# Patient Record
Sex: Male | Born: 1970
Health system: Southern US, Community
[De-identification: ages and names within clinical notes are randomized; demographics above are authoritative.]

## PROBLEM LIST (undated history)

## (undated) DIAGNOSIS — I10 Essential (primary) hypertension: Secondary | ICD-10-CM

## (undated) DIAGNOSIS — R519 Headache, unspecified: Secondary | ICD-10-CM

## (undated) DIAGNOSIS — Z9889 Other specified postprocedural states: Secondary | ICD-10-CM

## (undated) DIAGNOSIS — R51 Headache: Secondary | ICD-10-CM

## (undated) DIAGNOSIS — R112 Nausea with vomiting, unspecified: Secondary | ICD-10-CM

## (undated) HISTORY — PX: WISDOM TOOTH EXTRACTION: SHX21

---

## 2015-06-05 ENCOUNTER — Emergency Department (HOSPITAL_COMMUNITY)
Admission: EM | Admit: 2015-06-05 | Discharge: 2015-06-05 | Disposition: A | Discharge status: Home / Self Care | Payer: Managed Care, Other (non HMO) | Attending: Emergency Medicine | Admitting: Emergency Medicine

## 2015-06-05 ENCOUNTER — Emergency Department (HOSPITAL_COMMUNITY): Payer: Managed Care, Other (non HMO)

## 2015-06-05 ENCOUNTER — Encounter (HOSPITAL_COMMUNITY): Payer: Self-pay | Admitting: Emergency Medicine

## 2015-06-05 DIAGNOSIS — R1032 Left lower quadrant pain: Secondary | ICD-10-CM | POA: Diagnosis present

## 2015-06-05 DIAGNOSIS — K5792 Diverticulitis of intestine, part unspecified, without perforation or abscess without bleeding: Secondary | ICD-10-CM | POA: Insufficient documentation

## 2015-06-05 DIAGNOSIS — Z79899 Other long term (current) drug therapy: Secondary | ICD-10-CM | POA: Insufficient documentation

## 2015-06-05 DIAGNOSIS — I10 Essential (primary) hypertension: Secondary | ICD-10-CM | POA: Diagnosis not present

## 2015-06-05 HISTORY — DX: Essential (primary) hypertension: I10

## 2015-06-05 LAB — COMPREHENSIVE METABOLIC PANEL
ALBUMIN: 3.8 g/dL (ref 3.5–5.0)
ALT: 20 U/L (ref 17–63)
AST: 18 U/L (ref 15–41)
Alkaline Phosphatase: 79 U/L (ref 38–126)
Anion gap: 8 (ref 5–15)
BUN: 14 mg/dL (ref 6–20)
CHLORIDE: 106 mmol/L (ref 101–111)
CO2: 26 mmol/L (ref 22–32)
Calcium: 9.3 mg/dL (ref 8.9–10.3)
Creatinine, Ser: 1.15 mg/dL (ref 0.61–1.24)
GFR calc non Af Amer: >60 mL/min (ref >60)
Glucose, Bld: 101 mg/dL — ABNORMAL HIGH (ref 65–99)
POTASSIUM: 4.1 mmol/L (ref 3.5–5.1)
SODIUM: 140 mmol/L (ref 135–145)
TOTAL PROTEIN: 6.5 g/dL (ref 6.5–8.1)
Total Bilirubin: 0.4 mg/dL (ref 0.3–1.2)

## 2015-06-05 LAB — URINALYSIS, ROUTINE W REFLEX MICROSCOPIC
Bilirubin Urine: NEGATIVE
GLUCOSE, UA: NEGATIVE mg/dL
Hgb urine dipstick: NEGATIVE
KETONES UR: NEGATIVE mg/dL
LEUKOCYTES UA: NEGATIVE
NITRITE: NEGATIVE
PROTEIN: NEGATIVE mg/dL
Specific Gravity, Urine: 1.025 (ref 1.005–1.030)
pH: 6 (ref 5.0–8.0)

## 2015-06-05 LAB — CBC
HEMATOCRIT: 43.2 % (ref 39.0–52.0)
Hemoglobin: 14.3 g/dL (ref 13.0–17.0)
MCH: 28.7 pg (ref 26.0–34.0)
MCHC: 33.1 g/dL (ref 30.0–36.0)
MCV: 86.7 fL (ref 78.0–100.0)
PLATELETS: 171 10*3/uL (ref 150–400)
RBC: 4.98 MIL/uL (ref 4.22–5.81)
RDW: 13.5 % (ref 11.5–15.5)
WBC: 5.9 10*3/uL (ref 4.0–10.5)

## 2015-06-05 LAB — LIPASE, BLOOD: LIPASE: 39 U/L (ref 11–51)

## 2015-06-05 MED ORDER — CIPROFLOXACIN HCL 500 MG PO TABS
500.0000 mg | ORAL_TABLET | Freq: Once | ORAL | Status: AC
  Administered 2015-06-05: 500 mg via ORAL
  Filled 2015-06-05: qty 1

## 2015-06-05 MED ORDER — IOHEXOL 300 MG/ML  SOLN
100.0000 mL | Freq: Once | INTRAMUSCULAR | Status: AC | PRN
  Administered 2015-06-05: 80 mL via INTRAVENOUS

## 2015-06-05 MED ORDER — HYDROCODONE-ACETAMINOPHEN 5-325 MG PO TABS: 2.0000 | ORAL_TABLET | ORAL | Status: DC | PRN

## 2015-06-05 MED ORDER — METRONIDAZOLE 500 MG PO TABS
500.0000 mg | ORAL_TABLET | Freq: Once | ORAL | Status: AC
  Administered 2015-06-05: 500 mg via ORAL
  Filled 2015-06-05: qty 1

## 2015-06-05 MED ORDER — METRONIDAZOLE 500 MG PO TABS: 500.0000 mg | ORAL_TABLET | Freq: Three times a day (TID) | ORAL | Status: DC

## 2015-06-05 MED ORDER — CIPROFLOXACIN HCL 500 MG PO TABS: 500.0000 mg | ORAL_TABLET | Freq: Two times a day (BID) | ORAL | Status: DC

## 2015-06-05 MED ORDER — DOCUSATE SODIUM 100 MG PO CAPS: 100.0000 mg | ORAL_CAPSULE | Freq: Two times a day (BID) | ORAL | Status: DC

## 2015-06-05 NOTE — ED Notes (Signed)
CT notified that patient has completed contrast 

## 2015-06-05 NOTE — ED Notes (Signed)
Pt has returned from CT, NAD.

## 2015-06-05 NOTE — ED Provider Notes (Signed)
CSN: [id number]     Arrival date & time 06/05/15  0531 History   First MD Initiated Contact with Patient 06/05/15 [phone]     Chief Complaint  Patient presents with  . Abdominal Pain      HPI  She presents for evaluation of abdominal pain. Today's Tuesday. His symptoms started on Friday. Had some nausea. One episode of vomiting. Had some diarrhea for that day as well. Felt better the next day. However since that time he's had slowly progressive abdominal pain now localized to his left lower quadrant. Not severe. Pain with excessive movement. No pain with ADLs. No fevers no chills no bloody stools no nausea no vomiting since the initial episode. Has never had colonoscopy. His never been diagnosed with diverticulitis or previous diverticulitis. No abdominal surgeries.  Past Medical History  Diagnosis Date  . Hypertension    History reviewed. No pertinent past surgical history. No family history on file. Social History  Substance Use Topics  . Smoking status: Never Smoker   . Smokeless tobacco: None  . Alcohol Use: Yes    Review of Systems  Constitutional: Negative for fever, chills, diaphoresis, appetite change and fatigue.  HENT: Negative for mouth sores, sore throat and trouble swallowing.   Eyes: Negative for visual disturbance.  Respiratory: Negative for cough, chest tightness, shortness of breath and wheezing.   Cardiovascular: Negative for chest pain.  Gastrointestinal: Positive for nausea, vomiting, abdominal pain and diarrhea. Negative for abdominal distention.  Endocrine: Negative for polydipsia, polyphagia and polyuria.  Genitourinary: Negative for dysuria, frequency and hematuria.  Musculoskeletal: Negative for gait problem.  Skin: Negative for color change, pallor and rash.  Neurological: Negative for dizziness, syncope, light-headedness and headaches.  Hematological: Does not bruise/bleed easily.  Psychiatric/Behavioral: Negative for behavioral problems and confusion.        Allergies  Review of patient's allergies indicates no known allergies.  Home Medications   Prior to Admission medications   Medication Sig Start Date End Date Taking? Authorizing Provider  ciprofloxacin (CIPRO) 500 MG tablet Take 1 tablet (500 mg total) by mouth every 12 (twelve) hours. 06/05/15   Rolland PorterMark Kay Ricciuti, MD  lisinopril (PRINIVIL,ZESTRIL) 20 MG tablet Take 20 mg by mouth daily. 03/15/15  Yes Historical Provider, MD  metroNIDAZOLE (FLAGYL) 500 MG tablet Take 1 tablet (500 mg total) by mouth 3 (three) times daily. 06/05/15   Rolland PorterMark Chevon Laufer, MD   BP 115/81 mmHg  Pulse 54  Temp(Src) 97.9 F (36.6 C) (Oral)  Resp 14  Ht 5\' 10"  (1.778 m)  Wt 222 lb (100.699 kg)  BMI 31.85 kg/m2  SpO2 96% Physical Exam  Constitutional: He is oriented to person, place, and time. He appears well-developed and well-nourished. No distress.  HENT:  Head: Normocephalic.  Eyes: Conjunctivae are normal. Pupils are equal, round, and reactive to light. No scleral icterus.  Neck: Normal range of motion. Neck supple. No thyromegaly present.  Cardiovascular: Normal rate and regular rhythm.  Exam reveals no gallop and no friction rub.   No murmur heard. Pulmonary/Chest: Effort normal and breath sounds normal. No respiratory distress. He has no wheezes. He has no rales.  Abdominal: Soft. Bowel sounds are normal. He exhibits no distension. There is tenderness. There is no rebound.    Musculoskeletal: Normal range of motion.  Neurological: He is alert and oriented to person, place, and time.  Skin: Skin is warm and dry. No rash noted.  Psychiatric: He has a normal mood and affect. His behavior is normal.  ED Course  Procedures (including critical care time) Labs Review Labs Reviewed  COMPREHENSIVE METABOLIC PANEL - Abnormal; Notable for the following:    Glucose, Bld 101 (*)    All other components within normal limits  LIPASE, BLOOD  CBC  URINALYSIS, ROUTINE W REFLEX MICROSCOPIC (NOT AT [facility])     [facility] Abdomen Pelvis W Contrast  06/05/2015  CLINICAL DATA:  44 year old male with left lower quadrant pain for 1 week. Nausea and vomiting. Initial encounter. EXAM: CT ABDOMEN AND PELVIS WITH CONTRAST TECHNIQUE: Multidetector CT imaging of the abdomen and pelvis was performed using the standard protocol following bolus administration of intravenous contrast. CONTRAST:  80mL OMNIPAQUE IOHEXOL 300 MG/ML  SOLN COMPARISON:  None. FINDINGS: Dependent and streaky bilateral lower lobe opacity. No pleural effusions. Minimal atelectasis in the lingula. Other visible lung parenchyma is normal. No pericardial effusion. Negative visualized mediastinum. Mild lower lumbar facet degeneration. No acute osseous abnormality identified. No pelvic free fluid. Urinary bladder is mildly distended but otherwise unremarkable. Negative rectum aside from retained stool. Redundant sigmoid colon, partially gas distended. Moderate diverticulosis of the proximal sigmoid colon and continuing into the descending colon. No sigmoid inflammation. There is mild mesenteric stranding about the distal left colon diverticula (series 2, image 79). No extraluminal gas. Mild associated colonic wall thickening. No mesenteric lymphadenopathy. Diverticulosis abates in the proximal left colon. Negative splenic flexure and transverse colon aside from mild redundancy and retained stool. Oral contrast at the hepatic flexure. Negative right colon and appendix. Negative terminal ileum. No dilated or abnormal small bowel loops. Negative stomach and duodenum. No pneumoperitoneum or abdominal free fluid. Subtle evidence of cholelithiasis, otherwise negative gallbladder. No pericholecystic inflammation. Spleen, pancreas, and adrenal glands within normal limits. Portal venous system is patent. Major arterial structures are patent. Minimal calcified aortic atherosclerosis. No lymphadenopathy. Bilateral renal enhancement and contrast excretion within  normal limits. IMPRESSION: 1. Short 2-3 cm segment of acute inflammation in the distal left colon with mild wall thickening compatible with acute diverticulitis. No complicating features. Follow-up is recommended to exclude neoplasm which can sometimes mimic this appearance. 2. Sigmoid diverticulosis without active inflammation. 3. Cholelithiasis. 4. Streaky and confluent bilateral lower lobe opacity, but favor atelectasis. Electronically Signed   By: Odessa Fleming M.D.   On: 06/05/2015 10:25   I have personally reviewed and evaluated these images and lab results as part of my medical decision-making.   EKG Interpretation None      MDM   Final diagnoses:  Acute diverticulitis    CT does show a small segment of colonic inflammation adjacent diverticuli. I discussed this with him at length. Given by mouth Cipro, and by mouth Flagyl. Very appropriate for discharge home. Return precautions discussed.    Rolland Porter, MD 06/05/15 1046

## 2015-06-05 NOTE — Discharge Instructions (Signed)
Diverticulitis  Diverticulitis is when small pockets that have formed in your colon (large intestine) become infected or swollen.  HOME CARE  · Follow your doctor's instructions.  · Follow a special diet if told by your doctor.  · When you feel better, your doctor may tell you to change your diet. You may be told to eat a lot of fiber. Fruits and vegetables are good sources of fiber. Fiber makes it easier to poop (have bowel movements).  · Take supplements or probiotics as told by your doctor.  · Only take medicines as told by your doctor.  · Keep all follow-up visits with your doctor.  GET HELP IF:  · Your pain does not get better.  · You have a hard time eating food.  · You are not pooping like normal.  GET HELP RIGHT AWAY IF:  · Your pain gets worse.  · Your problems do not get better.  · Your problems suddenly get worse.  · You have a fever.  · You keep throwing up (vomiting).  · You have bloody or black, tarry poop (stool).  MAKE SURE YOU:   · Understand these instructions.  · Will watch your condition.  · Will get help right away if you are not doing well or get worse.     This information is not intended to replace advice given to you by your health care provider. Make sure you discuss any questions you have with your health care provider.     Document Released: 12/03/2007 Document Revised: 06/21/2013 Document Reviewed: 05/11/2013  Elsevier Interactive Patient Education ©2016 [employer]

## 2015-06-05 NOTE — ED Notes (Signed)
Pt. reports intermittent LLQ pain with nausea and emesis onset Saturday , denies fever or diarrhea. Denies pain at arrival.

## 2015-06-05 NOTE — ED Notes (Signed)
Pt transported to CT ?

## 2015-09-11 ENCOUNTER — Other Ambulatory Visit: Payer: Self-pay | Admitting: Gastroenterology

## 2015-09-11 DIAGNOSIS — K5732 Diverticulitis of large intestine without perforation or abscess without bleeding: Secondary | ICD-10-CM

## 2015-09-19 ENCOUNTER — Ambulatory Visit
Admission: RE | Admit: 2015-09-19 | Discharge: 2015-09-19 | Disposition: A | Discharge status: Home / Self Care | Payer: Managed Care, Other (non HMO) | Source: Ambulatory Visit | Attending: Gastroenterology | Admitting: Gastroenterology

## 2015-09-19 DIAGNOSIS — K5732 Diverticulitis of large intestine without perforation or abscess without bleeding: Secondary | ICD-10-CM

## 2015-10-05 ENCOUNTER — Other Ambulatory Visit (HOSPITAL_COMMUNITY): Payer: Self-pay | Admitting: Gastroenterology

## 2015-10-05 DIAGNOSIS — R1011 Right upper quadrant pain: Secondary | ICD-10-CM

## 2015-10-16 ENCOUNTER — Ambulatory Visit (HOSPITAL_COMMUNITY): Payer: Managed Care, Other (non HMO)

## 2015-10-23 ENCOUNTER — Ambulatory Visit (HOSPITAL_COMMUNITY)
Admission: RE | Admit: 2015-10-23 | Discharge: 2015-10-23 | Disposition: A | Discharge status: Home / Self Care | Payer: Managed Care, Other (non HMO) | Source: Ambulatory Visit | Attending: Gastroenterology | Admitting: Gastroenterology

## 2015-10-23 DIAGNOSIS — R1011 Right upper quadrant pain: Secondary | ICD-10-CM | POA: Insufficient documentation

## 2015-10-23 MED ORDER — TECHNETIUM TC 99M MEBROFENIN IV KIT
5.0000 | PACK | Freq: Once | INTRAVENOUS | Status: AC | PRN
  Administered 2015-10-23: 5 via INTRAVENOUS

## 2015-11-29 ENCOUNTER — Ambulatory Visit: Payer: Self-pay | Admitting: General Surgery

## 2015-11-29 NOTE — H&P (Signed)
History of Present Illness [facility](Romero Letizia MD; 11/29/2015 9:35 AM) The patient is a 45 year old male who presents for evaluation of gall stones. The patient is a 45 year old male who is referred by Dr. Evette CristalGanem for evaluation of symptomatic stones. Patient states she's had her quadrant/back pain over the last several months. He states becoming more frequent. Patient on ultrasound reveals sludge. Patient also had a HIDA scan which resulted in a normal ejection fraction.  Patient states that the pain usually resolves after high-fat diet.   Other Problems [facility](Ashley Beck, CMA; 11/29/2015 9:14 AM) Anxiety Disorder Back Pain Cholelithiasis High blood pressure  Past Surgical History [facility](Ashley Beck, CMA; 11/29/2015 9:14 AM) Oral Surgery  Allergies [facility](Ashley Beck, CMA; 11/29/2015 9:14 AM) No Known Drug Allergies06/07/2015  Medication History [facility](Ashley Beck, CMA; 11/29/2015 9:14 AM) Lisinopril-Hydrochlorothiazide (20-12.5MG  Tablet, Oral) Active. Medications Reconciled  Social History [facility]([place]; 11/29/2015 9:14 AM) Alcohol use Moderate alcohol use. No drug use Tobacco use Never smoker.  Family History [facility]([place]; 11/29/2015 9:14 AM) Arthritis Mother. Diabetes Mellitus Mother. Hypertension Mother. Respiratory Condition Father.    Review of Systems [facility](Sitlali Koerner MD; 11/29/2015 9:34 AM) General Present- Feeling well. Not Present- Fever. Skin Not Present- Change in Wart/Mole, Dryness, Hives, Jaundice, New Lesions, Non-Healing Wounds, Rash and Ulcer. HEENT Not Present- Earache, Hearing Loss, Hoarseness, Nose Bleed, Oral Ulcers, Ringing in the Ears, Seasonal Allergies, Sinus Pain, Sore Throat, Visual Disturbances, Wears glasses/contact lenses and Yellow Eyes. Respiratory Not Present- Cough and Difficulty Breathing. Cardiovascular Not Present- Chest Pain, Difficulty Breathing Lying Down, Leg Cramps, Palpitations, Rapid Heart Rate, Shortness of Breath and Swelling of  Extremities. Gastrointestinal Present- Change in Bowel Habits. Not Present- Abdominal Pain, Bloating, Bloody Stool, Chronic diarrhea, Constipation, Difficulty Swallowing, Excessive gas, Gets full quickly at meals, Hemorrhoids, Indigestion, Nausea, Rectal Pain and Vomiting. Musculoskeletal Not Present- Myalgia. Neurological Present- Headaches. Not Present- Decreased Memory, Fainting, Numbness, Seizures, Tingling, Tremor, Trouble walking and Weakness.  Vitals [facility](Ashley Beck CMA; 11/29/2015 9:15 AM) 11/29/2015 9:14 AM Weight: 225.8 lb Height: 70in Body Surface Area: 2.2 m Body Mass Index: 32.4 kg/m  Temp.: 97.48F  Pulse: 72 (Regular)  BP: 130/68 (Sitting, Left Arm, Standard)       Physical Exam [facility](Shandi Godfrey, MD; 11/29/2015 9:35 AM) General Mental Status-Alert. General Appearance-Consistent with stated age. Hydration-Well hydrated. Voice-Normal.  Head and Neck Head-normocephalic, atraumatic with no lesions or palpable masses.  Eye Eyeball - Bilateral-Extraocular movements intact. Sclera/Conjunctiva - Bilateral-No scleral icterus.  Chest and Lung Exam Chest and lung exam reveals -quiet, even and easy respiratory effort with no use of accessory muscles. Inspection Chest Wall - Normal. Back - normal.  Cardiovascular Cardiovascular examination reveals -normal heart sounds, regular rate and rhythm with no murmurs.  Abdomen Inspection Normal Exam - No Hernias. Palpation/Percussion Normal exam - Soft, Non Tender, No Rebound tenderness, No Rigidity (guarding) and No hepatosplenomegaly. Auscultation Normal exam - Bowel sounds normal.  Neurologic Neurologic evaluation reveals -alert and oriented x 3 with no impairment of recent or remote memory. Mental Status-Normal.  Musculoskeletal Normal Exam - Left-Upper Extremity Strength Normal and Lower Extremity Strength Normal. Normal Exam - Right-Upper Extremity Strength Normal, Lower Extremity  Weakness.    Assessment & Plan [facility](Jaqueline Uber MD; 11/29/2015 9:35 AM) SYMPTOMATIC CHOLELITHIASIS (K80.20) Impression: 45 year old male with symptomatic sludge/stones.  1. We will proceed to the operating room for a laparoscopic cholecystectomy 2. Risks and benefits were discussed with the patient to generally include, but not limited to: infection, bleeding, possible need for post op ERCP,  damage to the bile ducts, bile leak, and possible need for further surgery. Alternatives were offered and described. All questions were answered and the patient voiced understanding of the procedure and wishes to proceed at this point with a laparoscopic cholecystectomy

## 2015-12-06 NOTE — Patient Instructions (Signed)
[facility]  12/06/2015   Your procedure is scheduled on: 12/14/2015    [facility] to 3rd floor to  Short Stay [facility] at   1245pm.  Call this number if you have problems the morning of surgery [phone]   Remember: ONLY 1 PERSON MAY GO WITH YOU TO SHORT STAY TO GET  READY MORNING OF YOUR SURGERY.  Do not eat food after midnite.  May have clear liquids from 12 midnite until 0800am then nothing by mouth.       Take these medicines the morning of surgery with A SIP OF WATER: none                                 You may not have any metal on your body including hair pins and                     Men may shave face and neck.   Do not bring valuables to the hospital. [facility] IS NOT             RESPONSIBLE   FOR VALUABLES.  Contacts, dentures or bridgework may not be worn into surgery. .     Patients discharged the day of surgery will not be allowed to drive home.  Name and phone number of your driver:  Special Instructions: N/A              Please read over the following fact sheets you were given: _____________________________________________________________________                CLEAR LIQUID DIET   Foods Allowed                                                                     Foods Excluded  Coffee and tea, regular and decaf                             liquids that you cannot  Plain Jell-O in any flavor                                             see through such as: Fruit ices (not with fruit pulp)                                     milk, soups, orange juice  Iced Popsicles                                    All solid food Carbonated beverages, regular and diet  Cranberry, grape and apple juices Sports drinks like Gatorade Lightly seasoned clear broth or consume(fat free) Sugar, honey syrup  Sample Menu Breakfast                                Lunch                                      Supper Cranberry juice                    Beef broth                            Chicken broth Jell-O                                     Grape juice                           Apple juice Coffee or tea                        Jell-O                                      Popsicle                                                Coffee or tea                        Coffee or tea  _____________________________________________________________________  [facility] - Preparing for Surgery Before surgery, you can play an important role.  Because skin is not sterile, your skin needs to be as free of germs as possible.  You can reduce the number of germs on your skin by washing with CHG (chlorahexidine gluconate) soap before surgery.  CHG is an antiseptic cleaner which kills germs and bonds with the skin to continue killing germs even after washing. Please DO NOT use if you have an allergy to CHG or antibacterial soaps.  If your skin becomes reddened/irritated stop using the CHG and inform your nurse when you arrive at Short Stay. Do not shave (including legs and underarms) for at least 48 hours prior to the first CHG shower.  You may shave your face/neck. Please follow these instructions carefully:  1.  Shower with CHG Soap the night before surgery and the  morning of Surgery.  2.  If you choose to wash your hair, wash your hair first as usual with your  normal  shampoo.  3.  After you shampoo, rinse your hair and body thoroughly to remove the  shampoo.                           4.  Use CHG as you would any other liquid soap.  You can apply chg directly  to the skin and wash  Gently with a scrungie or clean washcloth.  5.  Apply the CHG Soap to your body ONLY FROM THE NECK DOWN.   Do not use on face/ open                           Wound or open sores. Avoid contact with eyes, ears mouth and genitals (private parts).                       Wash face,   Genitals (private parts) with your normal soap.             6.  Wash thoroughly, paying special attention to the area where your surgery  will be performed.  7.  Thoroughly rinse your body with warm water from the neck down.  8.  DO NOT shower/wash with your normal soap after using and rinsing off  the CHG Soap.                9.  Pat yourself dry with a clean towel.            10.  Wear clean pajamas.            11.  Place clean sheets on your bed the night of your first shower and do not  sleep with pets. Day of Surgery : Do not apply any lotions/deodorants the morning of surgery.  Please wear clean clothes to the hospital/surgery [facility].  FAILURE TO FOLLOW THESE INSTRUCTIONS MAY RESULT IN THE CANCELLATION OF YOUR SURGERY PATIENT SIGNATURE_________________________________  NURSE SIGNATURE__________________________________  ________________________________________________________________________

## 2015-12-07 ENCOUNTER — Encounter (HOSPITAL_COMMUNITY): Payer: Self-pay

## 2015-12-07 ENCOUNTER — Encounter (HOSPITAL_COMMUNITY)
Admission: RE | Admit: 2015-12-07 | Discharge: 2015-12-07 | Disposition: A | Discharge status: Home / Self Care | Payer: Managed Care, Other (non HMO) | Source: Ambulatory Visit | Attending: General Surgery | Admitting: General Surgery

## 2015-12-07 DIAGNOSIS — Z01810 Encounter for preprocedural cardiovascular examination: Secondary | ICD-10-CM | POA: Insufficient documentation

## 2015-12-07 DIAGNOSIS — I10 Essential (primary) hypertension: Secondary | ICD-10-CM | POA: Diagnosis not present

## 2015-12-07 DIAGNOSIS — K8080 Other cholelithiasis without obstruction: Secondary | ICD-10-CM | POA: Insufficient documentation

## 2015-12-07 DIAGNOSIS — Z01812 Encounter for preprocedural laboratory examination: Secondary | ICD-10-CM | POA: Insufficient documentation

## 2015-12-07 HISTORY — DX: Other specified postprocedural states: Z98.890

## 2015-12-07 HISTORY — DX: Headache: R51

## 2015-12-07 HISTORY — DX: Headache, unspecified: R51.9

## 2015-12-07 HISTORY — DX: Nausea with vomiting, unspecified: R11.2

## 2015-12-07 LAB — CBC
HCT: 45 % (ref 39.0–52.0)
Hemoglobin: 15 g/dL (ref 13.0–17.0)
MCH: 29.1 pg (ref 26.0–34.0)
MCHC: 33.3 g/dL (ref 30.0–36.0)
MCV: 87.4 fL (ref 78.0–100.0)
PLATELETS: 203 10*3/uL (ref 150–400)
RBC: 5.15 MIL/uL (ref 4.22–5.81)
RDW: 13.6 % (ref 11.5–15.5)
WBC: 6.5 10*3/uL (ref 4.0–10.5)

## 2015-12-07 LAB — BASIC METABOLIC PANEL
ANION GAP: 8 (ref 5–15)
BUN: 18 mg/dL (ref 6–20)
CO2: 28 mmol/L (ref 22–32)
Calcium: 9.5 mg/dL (ref 8.9–10.3)
Chloride: 101 mmol/L (ref 101–111)
Creatinine, Ser: 1.18 mg/dL (ref 0.61–1.24)
GFR calc Af Amer: >60 mL/min (ref >60)
Glucose, Bld: 97 mg/dL (ref 65–99)
POTASSIUM: 4.4 mmol/L (ref 3.5–5.1)
SODIUM: 137 mmol/L (ref 135–145)

## 2015-12-14 ENCOUNTER — Ambulatory Visit (HOSPITAL_COMMUNITY)
Admission: RE | Admit: 2015-12-14 | Discharge: 2015-12-14 | Disposition: A | Discharge status: Home / Self Care | Payer: Managed Care, Other (non HMO) | Source: Ambulatory Visit | Attending: General Surgery | Admitting: General Surgery

## 2015-12-14 ENCOUNTER — Ambulatory Visit (HOSPITAL_COMMUNITY): Payer: Managed Care, Other (non HMO) | Admitting: Anesthesiology

## 2015-12-14 ENCOUNTER — Encounter (HOSPITAL_COMMUNITY): Payer: Self-pay | Admitting: *Deleted

## 2015-12-14 ENCOUNTER — Encounter (HOSPITAL_COMMUNITY)
Admission: RE | Disposition: A | Discharge status: Home / Self Care | Payer: Self-pay | Source: Ambulatory Visit | Attending: General Surgery

## 2015-12-14 DIAGNOSIS — Z79899 Other long term (current) drug therapy: Secondary | ICD-10-CM | POA: Diagnosis not present

## 2015-12-14 DIAGNOSIS — K819 Cholecystitis, unspecified: Secondary | ICD-10-CM | POA: Diagnosis not present

## 2015-12-14 DIAGNOSIS — I10 Essential (primary) hypertension: Secondary | ICD-10-CM | POA: Insufficient documentation

## 2015-12-14 DIAGNOSIS — K8020 Calculus of gallbladder without cholecystitis without obstruction: Secondary | ICD-10-CM | POA: Diagnosis present

## 2015-12-14 HISTORY — PX: CHOLECYSTECTOMY: SHX55

## 2015-12-14 SURGERY — LAPAROSCOPIC CHOLECYSTECTOMY
Anesthesia: General | Site: Abdomen

## 2015-12-14 MED ORDER — OXYCODONE HCL 5 MG PO TABS
5.0000 mg | ORAL_TABLET | ORAL | Status: DC | PRN
  Administered 2015-12-14: 10 mg via ORAL
  Filled 2015-12-14: qty 2

## 2015-12-14 MED ORDER — SODIUM CHLORIDE 0.9% FLUSH: 3.0000 mL | Freq: Two times a day (BID) | INTRAVENOUS | Status: DC

## 2015-12-14 MED ORDER — CHLORHEXIDINE GLUCONATE 4 % EX LIQD: 1.0000 "application " | Freq: Once | CUTANEOUS | Status: DC

## 2015-12-14 MED ORDER — ACETAMINOPHEN 650 MG RE SUPP: 650.0000 mg | RECTAL | Status: DC | PRN

## 2015-12-14 MED ORDER — LIDOCAINE HCL (CARDIAC) 20 MG/ML IV SOLN
INTRAVENOUS | Status: DC | PRN
  Administered 2015-12-14: 100 mg via INTRAVENOUS

## 2015-12-14 MED ORDER — ONDANSETRON HCL 4 MG/2ML IJ SOLN
INTRAMUSCULAR | Status: AC
  Filled 2015-12-14: qty 2

## 2015-12-14 MED ORDER — HYDROMORPHONE HCL 1 MG/ML IJ SOLN
0.2500 mg | INTRAMUSCULAR | Status: DC | PRN
  Administered 2015-12-14 (×4): 0.5 mg via INTRAVENOUS

## 2015-12-14 MED ORDER — MORPHINE SULFATE (PF) 10 MG/ML IV SOLN: 2.0000 mg | INTRAVENOUS | Status: DC | PRN

## 2015-12-14 MED ORDER — CEFAZOLIN SODIUM-DEXTROSE 2-4 GM/100ML-% IV SOLN
2.0000 g | INTRAVENOUS | Status: AC
  Administered 2015-12-14: 2 g via INTRAVENOUS
  Filled 2015-12-14: qty 100

## 2015-12-14 MED ORDER — DEXAMETHASONE SODIUM PHOSPHATE 10 MG/ML IJ SOLN
INTRAMUSCULAR | Status: DC | PRN
  Administered 2015-12-14: 10 mg via INTRAVENOUS

## 2015-12-14 MED ORDER — CEFAZOLIN SODIUM-DEXTROSE 2-4 GM/100ML-% IV SOLN
INTRAVENOUS | Status: AC
  Filled 2015-12-14: qty 100

## 2015-12-14 MED ORDER — EPHEDRINE SULFATE 50 MG/ML IJ SOLN
INTRAMUSCULAR | Status: AC
  Filled 2015-12-14: qty 1

## 2015-12-14 MED ORDER — FENTANYL CITRATE (PF) 100 MCG/2ML IJ SOLN
INTRAMUSCULAR | Status: DC | PRN
  Administered 2015-12-14 (×2): 100 ug via INTRAVENOUS
  Administered 2015-12-14: 50 ug via INTRAVENOUS

## 2015-12-14 MED ORDER — MIDAZOLAM HCL 5 MG/5ML IJ SOLN
INTRAMUSCULAR | Status: DC | PRN
  Administered 2015-12-14: 2 mg via INTRAVENOUS

## 2015-12-14 MED ORDER — BUPIVACAINE-EPINEPHRINE (PF) 0.25% -1:200000 IJ SOLN
INTRAMUSCULAR | Status: DC | PRN
  Administered 2015-12-14: 13 mL

## 2015-12-14 MED ORDER — 0.9 % SODIUM CHLORIDE (POUR BTL) OPTIME
TOPICAL | Status: DC | PRN
  Administered 2015-12-14: 1000 mL

## 2015-12-14 MED ORDER — PROPOFOL 10 MG/ML IV BOLUS
INTRAVENOUS | Status: AC
  Filled 2015-12-14: qty 20

## 2015-12-14 MED ORDER — SUGAMMADEX SODIUM 200 MG/2ML IV SOLN
INTRAVENOUS | Status: DC | PRN
  Administered 2015-12-14: 200 mg via INTRAVENOUS

## 2015-12-14 MED ORDER — HYDROMORPHONE HCL 1 MG/ML IJ SOLN
INTRAMUSCULAR | Status: AC
  Filled 2015-12-14: qty 1

## 2015-12-14 MED ORDER — SODIUM CHLORIDE 0.9% FLUSH: 3.0000 mL | INTRAVENOUS | Status: DC | PRN

## 2015-12-14 MED ORDER — PROPOFOL 10 MG/ML IV BOLUS
INTRAVENOUS | Status: DC | PRN
  Administered 2015-12-14: 200 mg via INTRAVENOUS

## 2015-12-14 MED ORDER — ROCURONIUM BROMIDE 100 MG/10ML IV SOLN
INTRAVENOUS | Status: DC | PRN
  Administered 2015-12-14: 50 mg via INTRAVENOUS

## 2015-12-14 MED ORDER — ACETAMINOPHEN 325 MG PO TABS: 650.0000 mg | ORAL_TABLET | ORAL | Status: DC | PRN

## 2015-12-14 MED ORDER — SODIUM CHLORIDE 0.9 % IV SOLN: 250.0000 mL | INTRAVENOUS | Status: DC | PRN

## 2015-12-14 MED ORDER — DEXAMETHASONE SODIUM PHOSPHATE 10 MG/ML IJ SOLN
INTRAMUSCULAR | Status: AC
  Filled 2015-12-14: qty 1

## 2015-12-14 MED ORDER — SUGAMMADEX SODIUM 200 MG/2ML IV SOLN
INTRAVENOUS | Status: AC
  Filled 2015-12-14: qty 2

## 2015-12-14 MED ORDER — LACTATED RINGERS IV SOLN
INTRAVENOUS | Status: DC
  Administered 2015-12-14 (×2): via INTRAVENOUS

## 2015-12-14 MED ORDER — LIDOCAINE HCL (CARDIAC) 20 MG/ML IV SOLN
INTRAVENOUS | Status: AC
  Filled 2015-12-14: qty 5

## 2015-12-14 MED ORDER — FENTANYL CITRATE (PF) 250 MCG/5ML IJ SOLN
INTRAMUSCULAR | Status: AC
  Filled 2015-12-14: qty 5

## 2015-12-14 MED ORDER — BUPIVACAINE-EPINEPHRINE 0.25% -1:200000 IJ SOLN
INTRAMUSCULAR | Status: AC
  Filled 2015-12-14: qty 1

## 2015-12-14 MED ORDER — LACTATED RINGERS IR SOLN
Status: DC | PRN
  Administered 2015-12-14: 1000 mL

## 2015-12-14 MED ORDER — APAP-CAFF-DIHYDROCODEINE 325-30-16 MG PO TABS: 5.0000 mg | ORAL_TABLET | ORAL | Status: AC | PRN

## 2015-12-14 MED ORDER — SODIUM CHLORIDE 0.9 % IJ SOLN
INTRAMUSCULAR | Status: AC
  Filled 2015-12-14: qty 10

## 2015-12-14 MED ORDER — ROCURONIUM BROMIDE 100 MG/10ML IV SOLN
INTRAVENOUS | Status: AC
  Filled 2015-12-14: qty 1

## 2015-12-14 MED ORDER — MIDAZOLAM HCL 2 MG/2ML IJ SOLN
INTRAMUSCULAR | Status: AC
  Filled 2015-12-14: qty 2

## 2015-12-14 MED ORDER — ONDANSETRON HCL 4 MG/2ML IJ SOLN
INTRAMUSCULAR | Status: DC | PRN
  Administered 2015-12-14: 4 mg via INTRAVENOUS

## 2015-12-14 MED FILL — OXYCODONE/APAP 5/325 MG TAB: 5-325 | 2 days supply | Qty: 20 | Fill #0

## 2015-12-14 SURGICAL SUPPLY — 40 items
APPLIER CLIP 5 13 M/L LIGAMAX5 (MISCELLANEOUS)
BENZOIN TINCTURE PRP APPL 2/3 (GAUZE/BANDAGES/DRESSINGS) IMPLANT
CABLE HIGH FREQUENCY MONO STRZ (ELECTRODE) ×3 IMPLANT
CHLORAPREP W/TINT 26ML (MISCELLANEOUS) ×3 IMPLANT
CLIP APPLIE 5 13 M/L LIGAMAX5 (MISCELLANEOUS) IMPLANT
CLIP LIGATING HEMO O LOK GREEN (MISCELLANEOUS) ×3 IMPLANT
CLOSURE WOUND 1/2 X4 (GAUZE/BANDAGES/DRESSINGS) ×1
COVER MAYO STAND STRL (DRAPES) IMPLANT
COVER SURGICAL LIGHT HANDLE (MISCELLANEOUS) ×3 IMPLANT
COVER TRANSDUCER ULTRASND (DRAPES) ×3 IMPLANT
DECANTER SPIKE VIAL GLASS SM (MISCELLANEOUS) ×3 IMPLANT
DEVICE TROCAR PUNCTURE CLOSURE (ENDOMECHANICALS) ×3 IMPLANT
DRAPE C-ARM 42X120 X-RAY (DRAPES) IMPLANT
DRAPE LAPAROSCOPIC ABDOMINAL (DRAPES) ×3 IMPLANT
DRAPE UTILITY XL STRL (DRAPES) ×3 IMPLANT
ELECT REM PT RETURN 9FT ADLT (ELECTROSURGICAL) ×3
ELECTRODE REM PT RTRN 9FT ADLT (ELECTROSURGICAL) ×1 IMPLANT
GAUZE SPONGE 2X2 8PLY STRL LF (GAUZE/BANDAGES/DRESSINGS) ×1 IMPLANT
GAUZE SPONGE 4X4 12PLY STRL (GAUZE/BANDAGES/DRESSINGS) ×3 IMPLANT
GLOVE BIO SURGEON STRL SZ7.5 (GLOVE) ×3 IMPLANT
GOWN STRL REUS W/TWL XL LVL3 (GOWN DISPOSABLE) ×6 IMPLANT
HEMOSTAT SURGICEL 4X8 (HEMOSTASIS) IMPLANT
KIT BASIN OR (CUSTOM PROCEDURE TRAY) ×3 IMPLANT
NEEDLE INSUFFLATION 14GA 120MM (NEEDLE) ×3 IMPLANT
PAD POSITIONING PINK XL (MISCELLANEOUS) IMPLANT
POSITIONER SURGICAL ARM (MISCELLANEOUS) IMPLANT
SCISSORS LAP 5X35 DISP (ENDOMECHANICALS) ×3 IMPLANT
SET CHOLANGIOGRAPH MIX (MISCELLANEOUS) IMPLANT
SET IRRIG TUBING LAPAROSCOPIC (IRRIGATION / IRRIGATOR) ×3 IMPLANT
SPONGE GAUZE 2X2 STER 10/PKG (GAUZE/BANDAGES/DRESSINGS) ×2
STRIP CLOSURE SKIN 1/2X4 (GAUZE/BANDAGES/DRESSINGS) ×2 IMPLANT
SUT MNCRL AB 4-0 PS2 18 (SUTURE) ×3 IMPLANT
TAPE CLOTH 4X10 WHT NS (GAUZE/BANDAGES/DRESSINGS) IMPLANT
TOWEL OR 17X26 10 PK STRL BLUE (TOWEL DISPOSABLE) ×3 IMPLANT
TOWEL OR NON WOVEN STRL DISP B (DISPOSABLE) ×3 IMPLANT
TRAY LAPAROSCOPIC (CUSTOM PROCEDURE TRAY) ×3 IMPLANT
TROCAR BLADELESS OPT 5 75 (ENDOMECHANICALS) ×3 IMPLANT
TROCAR SLEEVE XCEL 5X75 (ENDOMECHANICALS) ×3 IMPLANT
TROCAR XCEL NON-BLD 11X100MML (ENDOMECHANICALS) ×3 IMPLANT
TUBING INSUF HEATED (TUBING) IMPLANT

## 2015-12-14 NOTE — Progress Notes (Addendum)
Paged Dr Derrell Lollingamirez as pharmacy will not have APAP-Caff-Dihydrocodeine until Monday June 19th.Dr Derrell Lollingamirez [facility] prescription for pain medication.   1715  Patient up to ambulate in hallway. Ambulated from 1302 to 1310 and tolerated well. Unable to void.

## 2015-12-14 NOTE — Anesthesia Preprocedure Evaluation (Addendum)
Anesthesia Evaluation  Patient identified by MRN, date of birth, ID band Patient awake    Reviewed: Allergy & Precautions, H&P , NPO status , Patient's Chart, lab work & pertinent test results  History of Anesthesia Complications (+) PONV  Airway Mallampati: I  TM Distance: >3 FB Neck ROM: Full    Dental no notable dental hx. (+) Teeth Intact, Dental Advisory Given   Pulmonary Current Smoker,    Pulmonary exam normal breath sounds clear to auscultation       Cardiovascular hypertension, Pt. on medications  Rhythm:Regular Rate:Normal     Neuro/Psych  Headaches, negative psych ROS   GI/Hepatic negative GI ROS, Neg liver ROS,   Endo/Other  negative endocrine ROS  Renal/GU negative Renal ROS  negative genitourinary   Musculoskeletal   Abdominal   Peds  Hematology negative hematology ROS (+)   Anesthesia Other Findings   Reproductive/Obstetrics negative OB ROS                           Anesthesia Physical Anesthesia Plan  ASA: II  Anesthesia Plan: General   Post-op Pain Management:    Induction: Intravenous  Airway Management Planned: Oral ETT  Additional Equipment:   Intra-op Plan:   Post-operative Plan: Extubation in OR  Informed Consent: I have reviewed the patients History and Physical, chart, labs and discussed the procedure including the risks, benefits and alternatives for the proposed anesthesia with the patient or authorized representative who has indicated his/her understanding and acceptance.   Dental advisory given  Plan Discussed with: CRNA  Anesthesia Plan Comments:         Anesthesia Quick Evaluation

## 2015-12-14 NOTE — H&P (View-Only) (Signed)
History of Present Illness Erik Stewart(Erik Plaia MD; 11/29/2015 9:35 AM) The patient is a 45 year old male who presents for evaluation of gall stones. The patient is a 45 year old male who is referred by Dr. Evette CristalGanem for evaluation of symptomatic stones. Patient states she's had her quadrant/back pain over the last several months. He states becoming more frequent. Patient on ultrasound reveals sludge. Patient also had a HIDA scan which resulted in a normal ejection fraction.  Patient states that the pain usually resolves after high-fat diet.   Other Problems Erik Stewart(Ashley Beck, CMA; 11/29/2015 9:14 AM) Anxiety Disorder Back Pain Cholelithiasis High blood pressure  Past Surgical History Erik Stewart(Ashley Beck, CMA; 11/29/2015 9:14 AM) Oral Surgery  Allergies Erik Stewart(Ashley Beck, CMA; 11/29/2015 9:14 AM) No Known Drug Allergies06/07/2015  Medication History Erik Stewart(Ashley Beck, CMA; 11/29/2015 9:14 AM) Lisinopril-Hydrochlorothiazide (20-12.5MG  Tablet, Oral) Active. Medications Reconciled  Social History Erik Stewart([place]; 11/29/2015 9:14 AM) Alcohol use Moderate alcohol use. No drug use Tobacco use Never smoker.  Family History Erik Stewart(Ash[place]; 11/29/2015 9:14 AM) Arthritis Mother. Diabetes Mellitus Mother. Hypertension Mother. Respiratory Condition Father.    Review of Systems Erik Stewart(Amaurie Schreckengost MD; 11/29/2015 9:34 AM) General Present- Feeling well. Not Present- Fever. Skin Not Present- Change in Wart/Mole, Dryness, Hives, Jaundice, New Lesions, Non-Healing Wounds, Rash and Ulcer. HEENT Not Present- Earache, Hearing Loss, Hoarseness, Nose Bleed, Oral Ulcers, Ringing in the Ears, Seasonal Allergies, Sinus Pain, Sore Throat, Visual Disturbances, Wears glasses/contact lenses and Yellow Eyes. Respiratory Not Present- Cough and Difficulty Breathing. Cardiovascular Not Present- Chest Pain, Difficulty Breathing Lying Down, Leg Cramps, Palpitations, Rapid Heart Rate, Shortness of Breath and Swelling of  Extremities. Gastrointestinal Present- Change in Bowel Habits. Not Present- Abdominal Pain, Bloating, Bloody Stool, Chronic diarrhea, Constipation, Difficulty Swallowing, Excessive gas, Gets full quickly at meals, Hemorrhoids, Indigestion, Nausea, Rectal Pain and Vomiting. Musculoskeletal Not Present- Myalgia. Neurological Present- Headaches. Not Present- Decreased Memory, Fainting, Numbness, Seizures, Tingling, Tremor, Trouble walking and Weakness.  Vitals Erik Stewart(Ashley Beck CMA; 11/29/2015 9:15 AM) 11/29/2015 9:14 AM Weight: 225.8 lb Height: 70in Body Surface Area: 2.2 m Body Mass Index: 32.4 kg/m  Temp.: 97.48F  Pulse: 72 (Regular)  BP: 130/68 (Sitting, Left Arm, Standard)       Physical Exam Erik Stewart(Gearld Kerstein, MD; 11/29/2015 9:35 AM) General Mental Status-Alert. General Appearance-Consistent with stated age. Hydration-Well hydrated. Voice-Normal.  Head and Neck Head-normocephalic, atraumatic with no lesions or palpable masses.  Eye Eyeball - Bilateral-Extraocular movements intact. Sclera/Conjunctiva - Bilateral-No scleral icterus.  Chest and Lung Exam Chest and lung exam reveals -quiet, even and easy respiratory effort with no use of accessory muscles. Inspection Chest Wall - Normal. Back - normal.  Cardiovascular Cardiovascular examination reveals -normal heart sounds, regular rate and rhythm with no murmurs.  Abdomen Inspection Normal Exam - No Hernias. Palpation/Percussion Normal exam - Soft, Non Tender, No Rebound tenderness, No Rigidity (guarding) and No hepatosplenomegaly. Auscultation Normal exam - Bowel sounds normal.  Neurologic Neurologic evaluation reveals -alert and oriented x 3 with no impairment of recent or remote memory. Mental Status-Normal.  Musculoskeletal Normal Exam - Left-Upper Extremity Strength Normal and Lower Extremity Strength Normal. Normal Exam - Right-Upper Extremity Strength Normal, Lower Extremity  Weakness.    Assessment & Plan Erik Stewart(Makana Rostad MD; 11/29/2015 9:35 AM) SYMPTOMATIC CHOLELITHIASIS (K80.20) Impression: 45 year old male with symptomatic sludge/stones.  1. We will proceed to the operating room for a laparoscopic cholecystectomy 2. Risks and benefits were discussed with the patient to generally include, but not limited to: infection, bleeding, possible need for post op ERCP,  damage to the bile ducts, bile leak, and possible need for further surgery. Alternatives were offered and described. All questions were answered and the patient voiced understanding of the procedure and wishes to proceed at this point with a laparoscopic cholecystectomy 

## 2015-12-14 NOTE — Interval H&P Note (Signed)
History and Physical Interval Note:  12/14/2015 10:41 AM  Erik Stewart  has presented today for surgery, with the diagnosis of Gallstones  The various methods of treatment have been discussed with the patient and family. After consideration of risks, benefits and other options for treatment, the patient has consented to  Procedure(s): LAPAROSCOPIC CHOLECYSTECTOMY (N/A) as a surgical intervention .  The patient's history has been reviewed, patient examined, no change in status, stable for surgery.  I have reviewed the patient's chart and labs.  Questions were answered to the patient's satisfaction.     Marigene Ehlersamirez Jr., Jed LimerickArmando

## 2015-12-14 NOTE — Discharge Instructions (Signed)
[facility] ______[facility] °LAPAROSCOPIC SURGERY: POST OP INSTRUCTIONS °Always review your discharge instruction sheet given to you by the facility where your surgery was performed. °IF YOU HAVE DISABILITY OR FAMILY LEAVE FORMS, YOU MUST BRING THEM TO THE OFFICE FOR PROCESSING.   °DO NOT GIVE THEM TO YOUR DOCTOR. ° °1. A prescription for pain medication may be given to you upon discharge.  Take your pain medication as prescribed, if needed.  If narcotic pain medicine is not needed, then you may take acetaminophen (Tylenol) or ibuprofen (Advil) as needed. °2. Take your usually prescribed medications unless otherwise directed. °3. If you need a refill on your pain medication, please contact your pharmacy.  They will contact our office to request authorization. Prescriptions will not be filled after 5pm or on week-ends. °4. You should follow a light diet the first few days after arrival home, such as soup and crackers, etc.  Be sure to include lots of fluids daily. °5. Most patients will experience some swelling and bruising in the area of the incisions.  Ice packs will help.  Swelling and bruising can take several days to resolve.  °6. It is common to experience some constipation if taking pain medication after surgery.  Increasing fluid intake and taking a stool softener (such as Colace) will usually help or prevent this problem from occurring.  A mild laxative (Milk of Magnesia or Miralax) should be taken according to package instructions if there are no bowel movements after 48 hours. °7. Unless discharge instructions indicate otherwise, you may remove your bandages 24-48 hours after surgery, and you may shower at that time.  You may have steri-strips (small skin tapes) in place directly over the incision.  These strips should be left on the skin for 7-10 days.  If your surgeon used skin glue on the incision, you may shower in 24 hours.  The glue will flake off over the next 2-3 weeks.  Any sutures or  staples will be removed at the office during your follow-up visit. °8. ACTIVITIES:  You may resume regular (light) daily activities beginning the next day--such as daily self-care, walking, climbing stairs--gradually increasing activities as tolerated.  You may have sexual intercourse when it is comfortable.  Refrain from any heavy lifting or straining until approved by your doctor. °a. You may drive when you are no longer taking prescription pain medication, you can comfortably wear a seatbelt, and you can safely maneuver your car and apply brakes. °b. RETURN TO WORK:  __________________________________________________________ °9. You should see your doctor in the office for a follow-up appointment approximately 2-3 weeks after your surgery.  Make sure that you call for this appointment within a day or two after you arrive home to insure a convenient appointment time. °10. OTHER INSTRUCTIONS: __________________________________________________________________________________________________________________________ __________________________________________________________________________________________________________________________ °WHEN TO CALL YOUR DOCTOR: °1. Fever over 101.0 °2. Inability to urinate °3. Continued bleeding from incision. °4. Increased pain, redness, or drainage from the incision. °5. Increasing abdominal pain ° °The clinic staff is available to answer your questions during regular business hours.  Please don’t hesitate to call and ask to speak to one of the nurses for clinical concerns.  If you have a medical emergency, go to the nearest emergency room or call 911.  A surgeon from [facility] is always on call at the hospital. °[address], [address], [place], [place]  [address] ? [address] [address], [place], [place]   [address] °[phone] ? [phone] ? FAX [phone] °Web site:   [email or web address] ° ° ° ° ° ° ° °General Anesthesia, Adult, Care After °Refer  to this sheet in the next few weeks. These instructions provide you with information on caring for yourself after your procedure. Your health care provider may also give you more specific instructions. Your treatment has been planned according to current medical practices, but problems sometimes occur. Call your health care provider if you have any problems or questions after your procedure. °WHAT TO EXPECT AFTER THE PROCEDURE °After the procedure, it is typical to experience: °· Sleepiness. °· Nausea and vomiting. °HOME CARE INSTRUCTIONS °· For the first 24 hours after general anesthesia: °¨ Have a responsible person with you. °¨ Do not drive a car. If you are alone, do not take public transportation. °¨ Do not drink alcohol. °¨ Do not take medicine that has not been prescribed by your health care provider. °¨ Do not sign important papers or make important decisions. °¨ You may resume a normal diet and activities as directed by your health care provider. °· Change bandages (dressings) as directed. °· If you have questions or problems that seem related to general anesthesia, call the hospital and ask for the anesthetist or anesthesiologist on call. °SEEK MEDICAL CARE IF: °· You have nausea and vomiting that continue the day after anesthesia. °· You develop a rash. °SEEK IMMEDIATE MEDICAL CARE IF:  °· You have difficulty breathing. °· You have chest pain. °· You have any allergic problems. °  °This information is not intended to replace advice given to you by your health care provider. Make sure you discuss any questions you have with your health care provider. °  °Document Released: 09/22/2000 Document Revised: 07/07/2014 Document Reviewed: 10/15/2011 °Elsevier Interactive Patient Education ©2016 [employer] Inc[employer] ° °

## 2015-12-14 NOTE — Transfer of Care (Signed)
Immediate Anesthesia Transfer of Care Note  Patient: Erik Stewart  Procedure(s) Performed: Procedure(s): LAPAROSCOPIC CHOLECYSTECTOMY (N/A)  Patient Location: PACU  Anesthesia Type:General  Level of Consciousness: awake, alert  and oriented  Airway & Oxygen Therapy: Patient Spontanous Breathing and Patient connected to face mask oxygen  Post-op Assessment: Report given to RN and Post -op Vital signs reviewed and stable  Post vital signs: Reviewed and stable  Last Vitals:  Filed Vitals:   12/14/15 1223  BP: 133/93  Pulse: 69  Temp: 36.8 C  Resp: 18    Last Pain:  Filed Vitals:   12/14/15 1242  PainSc: 3          Complications: No apparent anesthesia complications

## 2015-12-14 NOTE — Anesthesia Procedure Notes (Signed)
Procedure Name: Intubation Date/Time: 12/14/2015 2:10 PM Performed by: Thornell MuleSTUBBLEFIELD, Darryle Dennie G Pre-anesthesia Checklist: Patient identified, Emergency Drugs available, Suction available and Patient being monitored Patient Re-evaluated:Patient Re-evaluated prior to inductionOxygen Delivery Method: Circle system utilized Preoxygenation: Pre-oxygenation with 100% oxygen Intubation Type: IV induction Ventilation: Mask ventilation without difficulty Laryngoscope Size: Miller and 3 Grade View: Grade I Tube type: Oral Tube size: 7.5 mm Number of attempts: 1 Airway Equipment and Method: Stylet and Oral airway Placement Confirmation: ETT inserted through vocal cords under direct vision,  positive ETCO2 and breath sounds checked- equal and bilateral Secured at: 22 cm Tube secured with: Tape Dental Injury: Teeth and Oropharynx as per pre-operative assessment

## 2015-12-14 NOTE — Anesthesia Postprocedure Evaluation (Signed)
Anesthesia Post Note  Patient: Erik Stewart  Procedure(s) Performed: Procedure(s) (LRB): LAPAROSCOPIC CHOLECYSTECTOMY (N/A)  Patient location during evaluation: PACU Anesthesia Type: General Level of consciousness: awake and alert Pain management: pain level controlled Vital Signs Assessment: post-procedure vital signs reviewed and stable Respiratory status: spontaneous breathing, nonlabored ventilation and respiratory function stable Cardiovascular status: blood pressure returned to baseline and stable Postop Assessment: no signs of nausea or vomiting Anesthetic complications: no    Last Vitals:  Filed Vitals:   12/14/15 1535 12/14/15 1550  BP:  125/72  Pulse: 71 77  Temp: 36.8 C 36.7 C  Resp: 12 16    Last Pain:  Filed Vitals:   12/14/15 1557  PainSc: 8                  Deondrea Aguado,W. EDMOND

## 2015-12-14 NOTE — Op Note (Signed)
12/14/2015  2:45 PM  PATIENT:  Erik Stewart  45 y.o. male  PRE-OPERATIVE DIAGNOSIS:  Symptomatic Gallstones  POST-OPERATIVE DIAGNOSIS:  Gallstones  PROCEDURE:  Procedure(s): LAPAROSCOPIC CHOLECYSTECTOMY (N/A)  SURGEON:  Surgeon(s) and Role:    * Axel FillerArmando Omarii Scalzo, MD - Primary  ANESTHESIA:   local and general  EBL:   <5cc  BLOOD ADMINISTERED:none  DRAINS: none   LOCAL MEDICATIONS USED:  BUPIVICAINE   SPECIMEN:  Source of Specimen:  gallbladder  DISPOSITION OF SPECIMEN:  PATHOLOGY  COUNTS:  YES  TOURNIQUET:  * No tourniquets in log *  DICTATION: .Dragon Dictation Counts: reported as correct x 2   Findings:chronic inflammation of the gallbladder and gallstones  Indications for procedure: Pt is a 45 y/o M with RUQ pain and seen to have gallstones.   Details of the procedure: The patient was taken to the operating and placed in the supine position with bilateral SCDs in place. A time out was called and all facts were verified. A pneumoperitoneum was obtained via A Veress needle technique to a pressure of 14mm of mercury. A 5mm trochar was then placed in the right upper quadrant under visualization, and there were no injuries to any abdominal organs. A 11 mm port was then placed in the umbilical region after infiltrating with local anesthesia under direct visualization. A second epigastric port was placed under direct visualization.   The gallbladder was identified and retracted, the peritoneum was then sharply dissected from the gallbladder and this dissection was carried down to Calot's triangle. The cystic duct was identified and stripped away circumferentially and seen going into the gallbladder 360, the critical angle was obtained.    2 clips were placed proximally one distally and the cystic duct transected. The cystic artery was identified and 2 clips placed proximally and one distally and transected. We then proceeded to remove the gallbladder off the hepatic fossa with  Bovie cautery. A retrieval bag was then placed in the abdomen and gallbladder placed in the bag. The hepatic fossa was then reexamined and hemostasis was achieved with Bovie cautery and was excellent at this portion of the case. The subhepatic fossa and perihepatic fossa was then irrigated until the effluent was clear. The specimen bag and specimen were removed from the abdominal cavity.  The 11 mm trocar fascia was reapproximated with the Endo Close #1 Vicryl x1. The pneumoperitoneum was evacuated and all trochars removed under direct visulalization. The skin was then closed with 4-0 Monocryl and the skin dressed with Steri-Strips, gauze, and tape. The patient was awaken from general anesthesia and taken to the recovery room in stable condition.    PLAN OF CARE: Discharge to home after PACU  PATIENT DISPOSITION:  PACU - hemodynamically stable.   Delay start of Pharmacological VTE agent (>24hrs) due to surgical blood loss or risk of bleeding: not applicable

## 2015-12-17 ENCOUNTER — Encounter (HOSPITAL_COMMUNITY): Payer: Self-pay | Admitting: General Surgery

## 2017-06-11 IMAGING — US US ABDOMEN COMPLETE
1 series · 14 of 25 positions shown · non-contrast
Comparison: 06/05/2015 CT abdomen/pelvis.

CLINICAL DATA: Question of cholelithiasis on recent CT.
Intermittent right abdominal pain. History of distal colonic
diverticulitis.

EXAM:
ABDOMEN ULTRASOUND COMPLETE

[Series 1: us abdomen complete · 0.32mm/px · 14 of 96 slices shown]
[im 1/96]
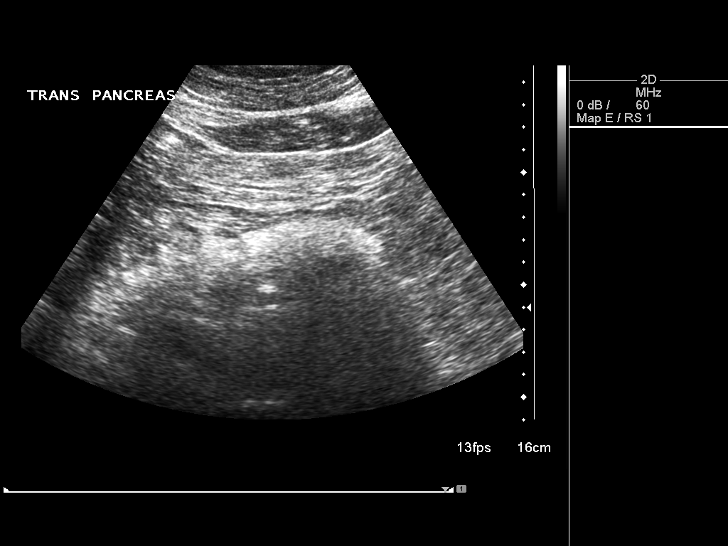
[im 8/96]
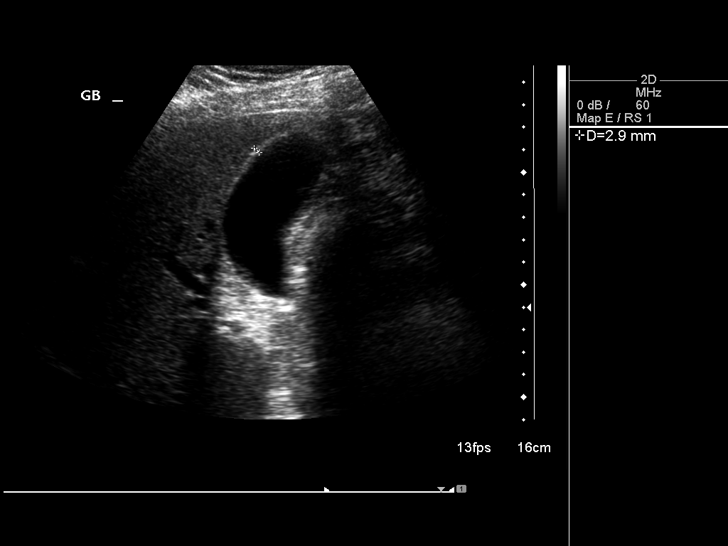
[im 16/96]
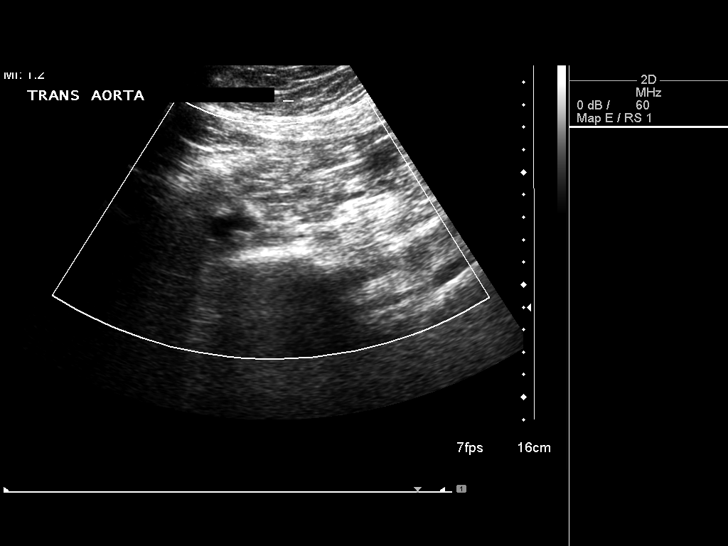
[im 24/96]
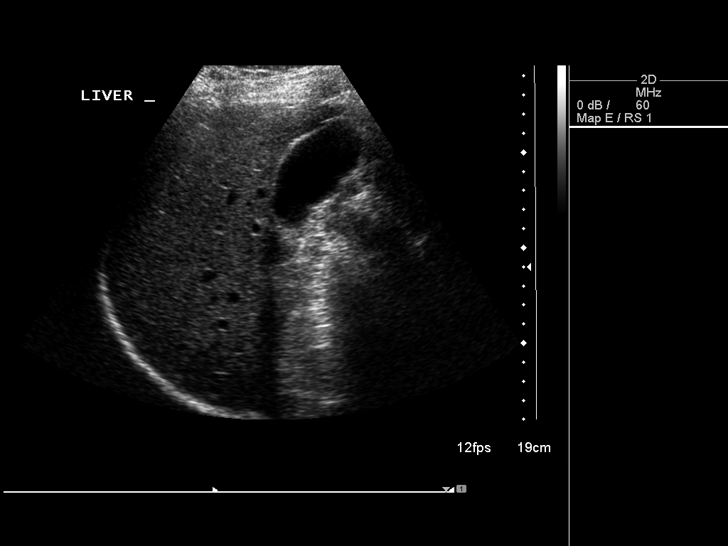
[im 32/96]
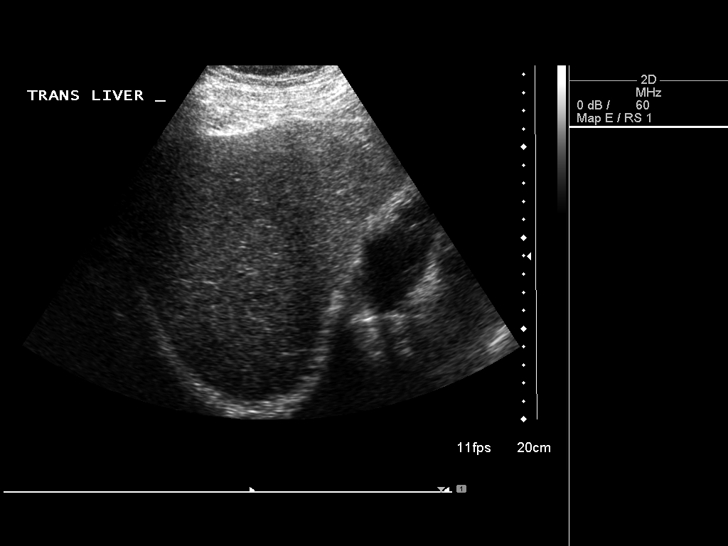
[im 36/96]
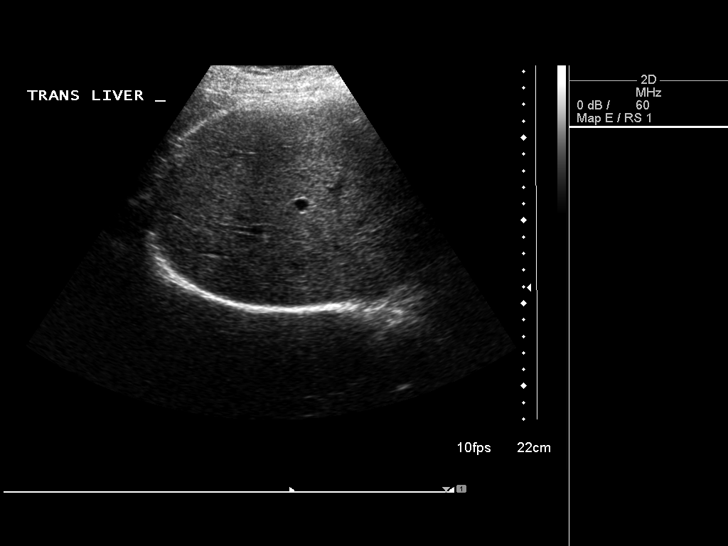
[im 44/96]
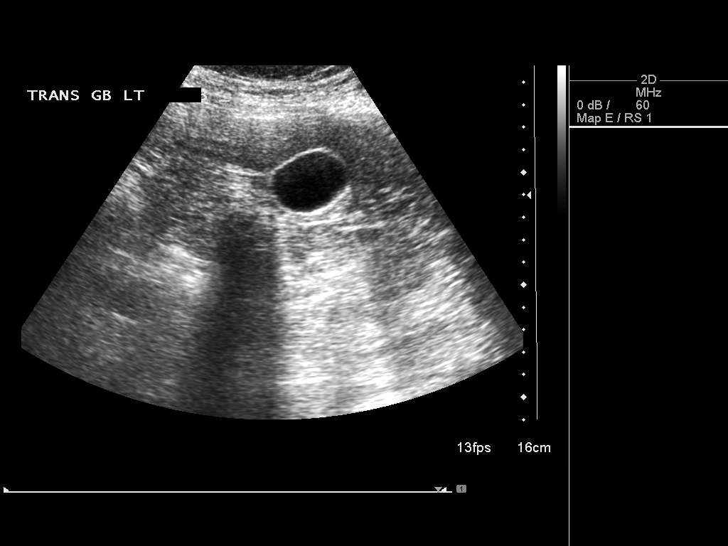
[im 52/96]
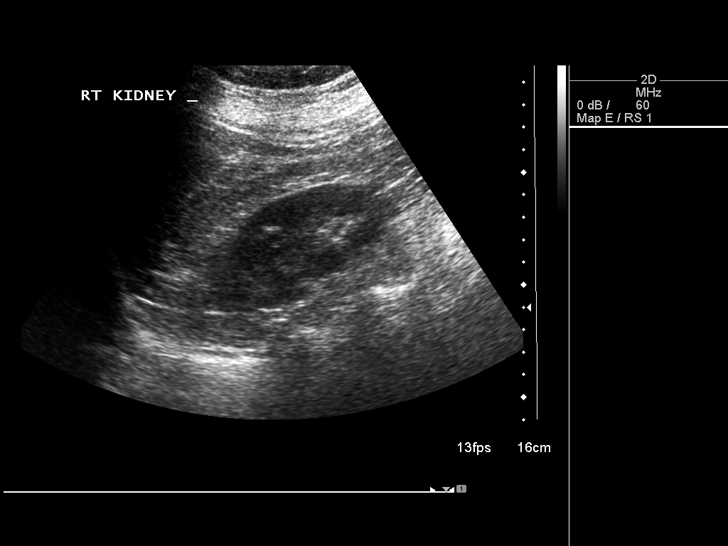
[im 60/96]
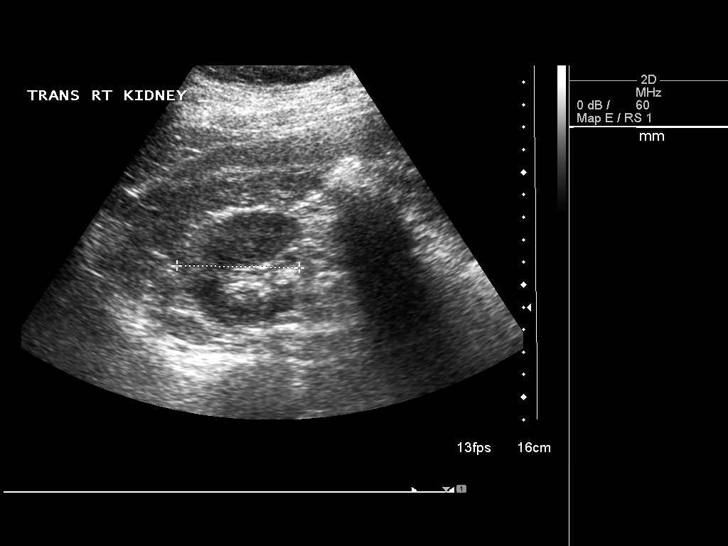
[im 64/96]
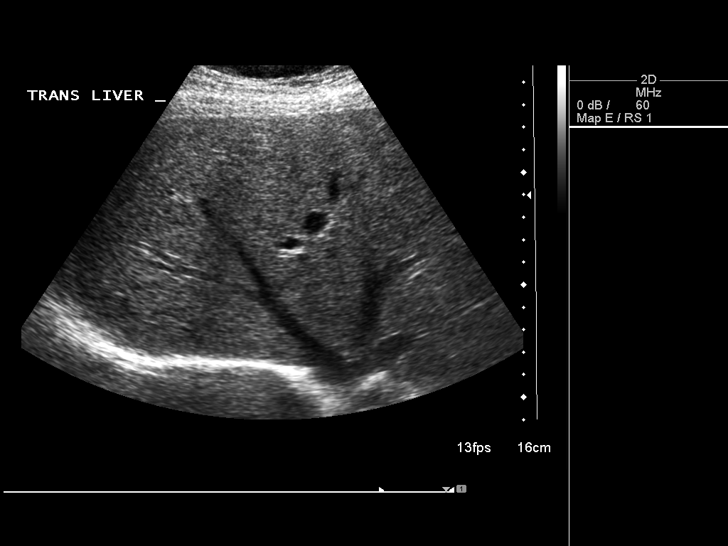
[im 72/96]
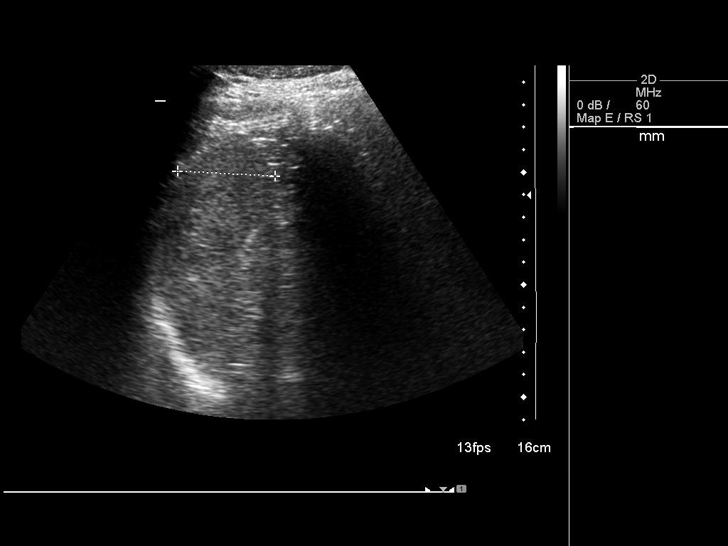
[im 80/96]
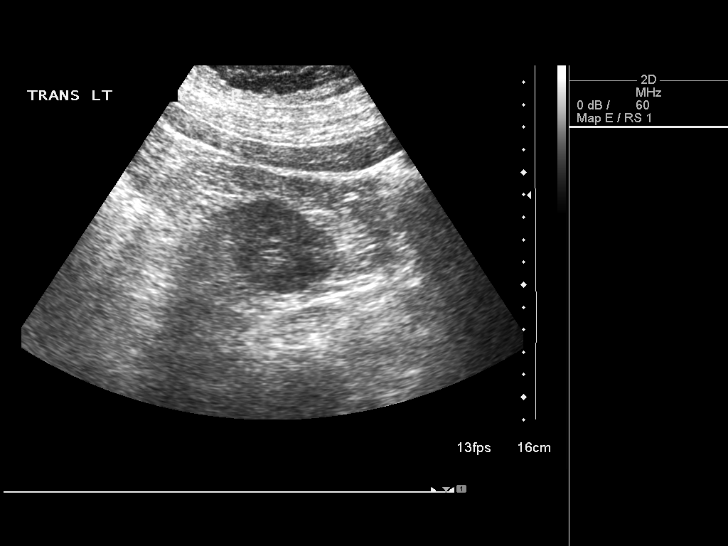
[im 88/96]
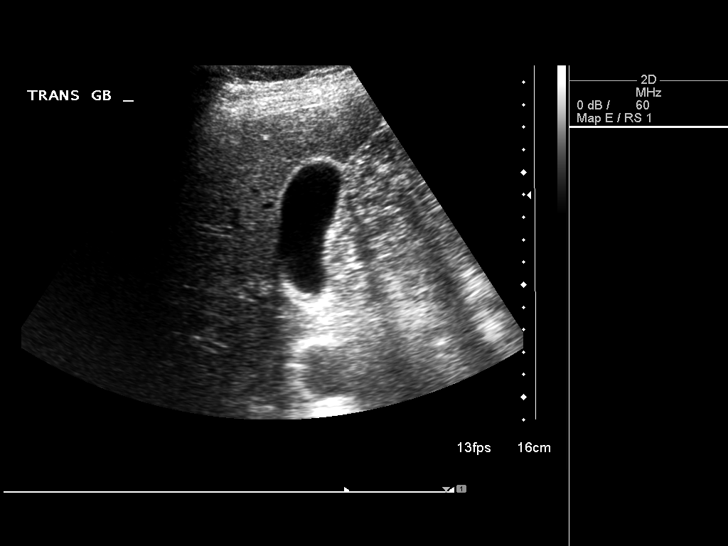
[im 96/96]
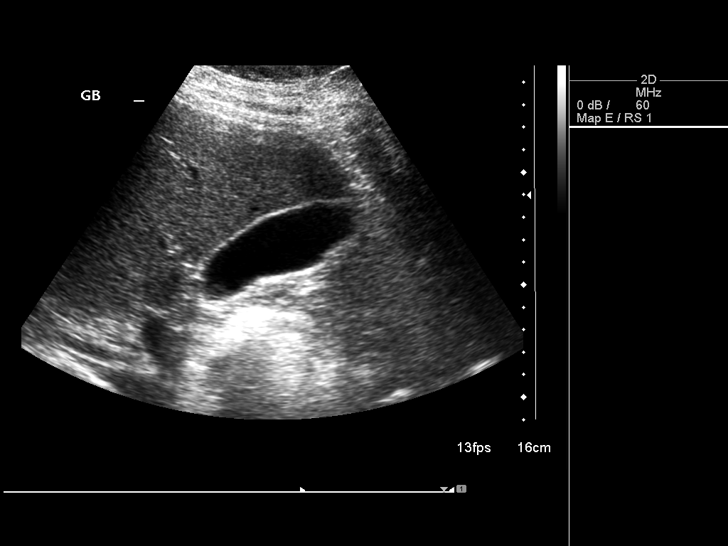

[14 of 25 positions shown; findings below may reference images not displayed]

FINDINGS: Gallbladder: No gallbladder distention. There is a small amount of
mobile sludge in the gallbladder, with no shadowing gallstones. No
gallbladder wall thickening, pericholecystic fluid or sonographic
Murphy's sign.

Common bile duct: Diameter: 3 mm

Liver: No focal lesion identified. Within normal limits in
parenchymal echogenicity.

IVC: No abnormality visualized.

Pancreas: Visualized portion unremarkable.

Spleen: Size and appearance within normal limits.

Right Kidney: Length: 10.6 cm. Echogenicity within normal limits. No
mass or hydronephrosis visualized.

Left Kidney: Length: 10.8 cm. Echogenicity within normal limits. No
mass or hydronephrosis visualized.

Abdominal aorta: No aneurysm visualized.

Other findings: None.
IMPRESSION: 1. Small amount of gallbladder sludge, with no cholelithiasis and no
evidence of acute cholecystitis.
2. Otherwise normal abdominal sonogram, with no biliary ductal
dilatation.
